# Patient Record
Sex: Male | Born: 1970 | Race: White | Hispanic: No | Marital: Single | Smoking: Never smoker
Health system: Southern US, Community
[De-identification: ages and names within clinical notes are randomized; demographics above are authoritative.]

---

## 2003-05-21 ENCOUNTER — Ambulatory Visit (HOSPITAL_COMMUNITY): Admission: RE | Admit: 2003-05-21 | Discharge: 2003-05-21 | Payer: Self-pay | Admitting: Family Medicine

## 2004-02-09 ENCOUNTER — Emergency Department (HOSPITAL_COMMUNITY): Admission: EM | Admit: 2004-02-09 | Discharge: 2004-02-09 | Payer: Self-pay | Admitting: Emergency Medicine

## 2004-10-20 ENCOUNTER — Ambulatory Visit (HOSPITAL_COMMUNITY): Admission: RE | Admit: 2004-10-20 | Discharge: 2004-10-20 | Payer: Self-pay | Admitting: Family Medicine

## 2007-11-01 ENCOUNTER — Ambulatory Visit (HOSPITAL_COMMUNITY): Admission: RE | Admit: 2007-11-01 | Discharge: 2007-11-01 | Payer: Self-pay | Admitting: Family Medicine

## 2013-04-17 ENCOUNTER — Emergency Department (HOSPITAL_COMMUNITY): Admission: EM | Admit: 2013-04-17 | Discharge: 2013-04-17 | Discharge status: Against Medical Advice | Payer: Self-pay

## 2014-05-13 ENCOUNTER — Ambulatory Visit (HOSPITAL_COMMUNITY)
Admission: RE | Admit: 2014-05-13 | Discharge: 2014-05-13 | Disposition: A | Discharge status: Home / Self Care | Payer: BLUE CROSS/BLUE SHIELD | Source: Ambulatory Visit | Attending: Internal Medicine | Admitting: Internal Medicine

## 2014-05-13 ENCOUNTER — Encounter (INDEPENDENT_AMBULATORY_CARE_PROVIDER_SITE_OTHER): Payer: Self-pay

## 2014-05-13 ENCOUNTER — Other Ambulatory Visit (HOSPITAL_COMMUNITY): Payer: Self-pay | Admitting: Internal Medicine

## 2014-05-13 DIAGNOSIS — R509 Fever, unspecified: Secondary | ICD-10-CM

## 2014-05-13 DIAGNOSIS — J189 Pneumonia, unspecified organism: Secondary | ICD-10-CM | POA: Insufficient documentation

## 2014-05-13 DIAGNOSIS — R05 Cough: Secondary | ICD-10-CM | POA: Diagnosis present

## 2014-05-27 ENCOUNTER — Other Ambulatory Visit: Payer: Self-pay | Admitting: Internal Medicine

## 2014-05-27 ENCOUNTER — Ambulatory Visit
Admission: RE | Admit: 2014-05-27 | Discharge: 2014-05-27 | Disposition: A | Discharge status: Home / Self Care | Payer: BC Managed Care – PPO | Source: Ambulatory Visit | Attending: Internal Medicine | Admitting: Internal Medicine

## 2014-05-27 DIAGNOSIS — Z8701 Personal history of pneumonia (recurrent): Secondary | ICD-10-CM

## 2015-01-16 ENCOUNTER — Emergency Department (HOSPITAL_COMMUNITY): Payer: BC Managed Care – PPO

## 2015-01-16 ENCOUNTER — Encounter (HOSPITAL_COMMUNITY): Payer: Self-pay | Admitting: *Deleted

## 2015-01-16 ENCOUNTER — Emergency Department (HOSPITAL_COMMUNITY)
Admission: EM | Admit: 2015-01-16 | Discharge: 2015-01-16 | Disposition: A | Discharge status: Home / Self Care | Payer: BC Managed Care – PPO | Attending: Emergency Medicine | Admitting: Emergency Medicine

## 2015-01-16 DIAGNOSIS — N23 Unspecified renal colic: Secondary | ICD-10-CM | POA: Insufficient documentation

## 2015-01-16 DIAGNOSIS — Z79899 Other long term (current) drug therapy: Secondary | ICD-10-CM | POA: Diagnosis not present

## 2015-01-16 DIAGNOSIS — R109 Unspecified abdominal pain: Secondary | ICD-10-CM | POA: Diagnosis present

## 2015-01-16 DIAGNOSIS — N200 Calculus of kidney: Secondary | ICD-10-CM | POA: Diagnosis not present

## 2015-01-16 LAB — URINALYSIS, ROUTINE W REFLEX MICROSCOPIC
BILIRUBIN URINE: NEGATIVE
GLUCOSE, UA: NEGATIVE mg/dL
HGB URINE DIPSTICK: NEGATIVE
LEUKOCYTES UA: NEGATIVE
Nitrite: NEGATIVE
PH: 5.5 (ref 5.0–8.0)
PROTEIN: NEGATIVE mg/dL
Specific Gravity, Urine: >1.03 — ABNORMAL HIGH (ref 1.005–1.030)
Urobilinogen, UA: 0.2 mg/dL (ref 0.0–1.0)

## 2015-01-16 LAB — BASIC METABOLIC PANEL
Anion gap: 7 (ref 5–15)
BUN: 11 mg/dL (ref 6–20)
CALCIUM: 9.3 mg/dL (ref 8.9–10.3)
CO2: 27 mmol/L (ref 22–32)
CREATININE: 1.04 mg/dL (ref 0.61–1.24)
Chloride: 103 mmol/L (ref 101–111)
GFR calc Af Amer: >60 mL/min (ref >60)
GLUCOSE: 135 mg/dL — AB (ref 65–99)
Potassium: 3.1 mmol/L — ABNORMAL LOW (ref 3.5–5.1)
Sodium: 137 mmol/L (ref 135–145)

## 2015-01-16 LAB — CBC WITH DIFFERENTIAL/PLATELET
BASOS ABS: 0 10*3/uL (ref 0.0–0.1)
BASOS PCT: 0 %
EOS PCT: 0 %
Eosinophils Absolute: 0 10*3/uL (ref 0.0–0.7)
HEMATOCRIT: 39.9 % (ref 39.0–52.0)
Hemoglobin: 12.9 g/dL — ABNORMAL LOW (ref 13.0–17.0)
Lymphocytes Relative: 9 %
Lymphs Abs: 1.2 10*3/uL (ref 0.7–4.0)
MCH: 26.5 pg (ref 26.0–34.0)
MCHC: 32.3 g/dL (ref 30.0–36.0)
MCV: 82.1 fL (ref 78.0–100.0)
MONO ABS: 0.6 10*3/uL (ref 0.1–1.0)
MONOS PCT: 4 %
Neutro Abs: 12.3 10*3/uL — ABNORMAL HIGH (ref 1.7–7.7)
Neutrophils Relative %: 87 %
PLATELETS: 233 10*3/uL (ref 150–400)
RBC: 4.86 MIL/uL (ref 4.22–5.81)
RDW: 14.6 % (ref 11.5–15.5)
WBC: 14.1 10*3/uL — ABNORMAL HIGH (ref 4.0–10.5)

## 2015-01-16 MED ORDER — HYDROMORPHONE HCL 1 MG/ML IJ SOLN
1.0000 mg | Freq: Once | INTRAMUSCULAR | Status: AC
  Administered 2015-01-16: 1 mg via INTRAVENOUS
  Filled 2015-01-16: qty 1

## 2015-01-16 MED ORDER — TAMSULOSIN HCL 0.4 MG PO CAPS: 0.4000 mg | ORAL_CAPSULE | Freq: Every day | ORAL | Status: DC

## 2015-01-16 MED ORDER — SODIUM CHLORIDE 0.9 % IV BOLUS (SEPSIS)
1000.0000 mL | Freq: Once | INTRAVENOUS | Status: AC
  Administered 2015-01-16: 1000 mL via INTRAVENOUS

## 2015-01-16 MED ORDER — OXYCODONE-ACETAMINOPHEN 5-325 MG PO TABS: ORAL_TABLET | ORAL | Status: AC

## 2015-01-16 MED ORDER — NAPROXEN 250 MG PO TABS: 250.0000 mg | ORAL_TABLET | Freq: Two times a day (BID) | ORAL | Status: DC | PRN

## 2015-01-16 MED ORDER — KETOROLAC TROMETHAMINE 30 MG/ML IJ SOLN
30.0000 mg | Freq: Once | INTRAMUSCULAR | Status: AC
  Administered 2015-01-16: 30 mg via INTRAVENOUS
  Filled 2015-01-16: qty 1

## 2015-01-16 MED ORDER — FENTANYL CITRATE (PF) 100 MCG/2ML IJ SOLN
50.0000 ug | INTRAMUSCULAR | Status: DC | PRN
  Administered 2015-01-16: 50 ug via INTRAVENOUS
  Filled 2015-01-16: qty 2

## 2015-01-16 MED ORDER — NAPROXEN 250 MG PO TABS: 250.0000 mg | ORAL_TABLET | Freq: Two times a day (BID) | ORAL | Status: AC

## 2015-01-16 MED ORDER — TAMSULOSIN HCL 0.4 MG PO CAPS: 0.4000 mg | ORAL_CAPSULE | Freq: Every day | ORAL | Status: AC

## 2015-01-16 MED ORDER — ONDANSETRON HCL 4 MG PO TABS: 4.0000 mg | ORAL_TABLET | Freq: Three times a day (TID) | ORAL | Status: AC | PRN

## 2015-01-16 MED ORDER — HYDROMORPHONE HCL 1 MG/ML IJ SOLN
1.0000 mg | Freq: Once | INTRAMUSCULAR | Status: AC
Start: 2015-01-16 — End: 2015-01-16
  Administered 2015-01-16: 1 mg via INTRAVENOUS
  Filled 2015-01-16: qty 1

## 2015-01-16 MED ORDER — ONDANSETRON HCL 4 MG/2ML IJ SOLN
4.0000 mg | Freq: Once | INTRAMUSCULAR | Status: AC
  Administered 2015-01-16: 4 mg via INTRAVENOUS
  Filled 2015-01-16: qty 2

## 2015-01-16 NOTE — ED Notes (Signed)
Paged Dr Jena Gaussourk to Dr Bebe ShaggyWickline @ [phone]

## 2015-01-16 NOTE — ED Notes (Signed)
Discharge papers given to pt at this time, discussed pain medication . Verbalized understanding. Ambulated off unit with family

## 2015-01-16 NOTE — ED Provider Notes (Signed)
Pt received at sign out with Udip pending. Pt was also given another dose of pain meds, without improvement of his pain. IV toradol and fentanyl given. CT as below. Udip without infection. Pt states he feels "much better now" and wants to go home. Dx and testing d/w pt and family.  Questions answered.  Verb understanding, agreeable to d/c home with outpt f/u.    Ct Renal Stone Study 01/16/2015  CLINICAL DATA:  Left flank pain with trace hematuria EXAM: CT ABDOMEN AND PELVIS WITHOUT CONTRAST TECHNIQUE: Multidetector CT imaging of the abdomen and pelvis was performed following the standard protocol without IV contrast. COMPARISON:  None. FINDINGS: Lung bases are free of acute infiltrate or sizable effusion. The liver, gallbladder, spleen, adrenal glands and pancreas are all normal in their CT appearance. The right kidney is well visualized and demonstrates no renal calculi or obstructive changes. The left kidney demonstrates significant hydronephrosis as well as perinephric stranding. The ureter is prominent and in the distal most aspect of the left ureter there is a tiny 1-2 mm stone identified best seen on image number 76 of series 2. The bladder is well distended. No pelvic mass lesion is seen. The appendix is within normal limits. The vascular hand bony structures are within normal limits. IMPRESSION: 1-2 mm stone in the distal left ureter with hydronephrosis and hydroureter. No other focal abnormality is noted. Electronically Signed   By: Alcide CleverMark  Lukens M.D.   On: 01/16/2015 18:37     Samuel JesterKathleen Ankita Newcomer, DO 01/16/15 1850

## 2015-01-16 NOTE — ED Provider Notes (Signed)
CSN: [id number]     Arrival date & time 01/16/15  1147 History   First MD Initiated Contact with Patient 01/16/15 1348     Chief Complaint  Patient presents with  . Flank Pain     Patient is a 44 y.o. male presenting with flank pain. The history is provided by the patient.  Flank Pain This is a new problem. The current episode started 1 to 2 hours ago. The problem occurs constantly. The problem has been gradually worsening. Associated symptoms include abdominal pain. Pertinent negatives include no chest pain and no shortness of breath. Nothing aggravates the symptoms. Nothing relieves the symptoms.  pt reports sudden onset of left flank pain that radiates to lower abdomen No fever/vomiting He has never had this before Denies h/o kidney stones No CP/SOB No weakness reported   PMH - none  Social History  Substance Use Topics  . Smoking status: Never Smoker   . Smokeless tobacco: Current User  . Alcohol Use: No    Review of Systems  Constitutional: Negative for fever.  Respiratory: Negative for shortness of breath.   Cardiovascular: Negative for chest pain.  Gastrointestinal: Positive for abdominal pain.  Genitourinary: Positive for flank pain.  All other systems reviewed and are negative.     Allergies  Sulfur  Home Medications   Prior to Admission medications   Medication Sig Start Date End Date Taking? Authorizing Provider  acetaminophen (TYLENOL) 500 MG tablet Take 500 mg by mouth every 6 (six) hours as needed.   Yes Historical Provider, MD  Ascorbic Acid (VITAMIN C PO) Take 1 tablet by mouth daily.   Yes Historical Provider, MD  dextromethorphan-guaiFENesin (MUCINEX DM) 30-600 MG 12hr tablet Take 2 tablets by mouth 2 (two) times daily.   Yes Historical Provider, MD   BP 117/75 mmHg  Pulse 87  Temp(Src) 97.5 F (36.4 C) (Oral)  Resp 18  Ht 6' (1.829 m)  Wt 150 lb (68.04 kg)  BMI 20.34 kg/m2  SpO2 99% Physical Exam CONSTITUTIONAL: Well developed/well  nourished HEAD: Normocephalic/atraumatic EYES: EOMI/PERRL ENMT: Mucous membranes moist NECK: supple no meningeal signs SPINE/BACK:entire spine nontender CV: S1/S2 noted, no murmurs/rubs/gallops noted LUNGS: Lungs are clear to auscultation bilaterally, no apparent distress ABDOMEN: soft, nontender, no rebound or guarding, bowel sounds noted throughout abdomen GN:FAOZGU:left cva tenderness, no inguinal hernia, no scrotal tenderness noted NEURO: Pt is awake/alert/appropriate, moves all extremitiesx4.  No facial droop.   EXTREMITIES: pulses normal/equal, full ROM SKIN: warm, color normal PSYCH: no abnormalities of mood noted, alert and oriented to situation  ED Course  Procedures  Labs Review Labs Reviewed  CBC WITH DIFFERENTIAL/PLATELET - Abnormal; Notable for the following:    WBC 14.1 (*)    Hemoglobin 12.9 (*)    Neutro Abs 12.3 (*)    All other components within normal limits  URINALYSIS, ROUTINE W REFLEX MICROSCOPIC (NOT AT [facility])  BASIC METABOLIC PANEL    I have personally reviewed and evaluated these lab results as part of my medical decision-making.   Medications  HYDROmorphone (DILAUDID) injection 1 mg (1 mg Intravenous Given 01/16/15 1428)  ondansetron (ZOFRAN) injection 4 mg (4 mg Intravenous Given 01/16/15 1428)  sodium chloride 0.9 % bolus 1,000 mL (1,000 mLs Intravenous New Bag/Given 01/16/15 1428)   4:01 PM Pt with likely new onset kidney stone Urinalysis pending At signout to dr Clarene Dukemcmanus, f/u on urinalysis and if pain uncontrolled may need CT imaging MDM   Final diagnoses:  None    Nursing notes including  past medical history and social history reviewed and considered in documentation Labs/vital reviewed myself and considered during evaluation     Zadie Rhine, MD 01/16/15 1601

## 2015-01-16 NOTE — ED Notes (Signed)
Pt encouraged to provide urine sample- instructed that if unable to void will probably need to be catheterized -- Pt stated that he would attempt to provide sample

## 2015-01-16 NOTE — ED Notes (Signed)
Patient c/o left flank pain that started about an hour ago. Denies dysuria or hx of kidney stones.

## 2015-01-16 NOTE — Discharge Instructions (Signed)
°Emergency Department Resource Guide °1) Find a Doctor and Pay Out of Pocket °Although you won't have to find out who is covered by your insurance plan, it is a good idea to ask around and get recommendations. You will then need to call the office and see if the doctor you have chosen will accept you as a new patient and what types of options they offer for patients who are self-pay. Some doctors offer discounts or will set up payment plans for their patients who do not have insurance, but you will need to ask so you aren't surprised when you get to your appointment. ° °2) Contact Your Local Health Department °Not all health departments have doctors that can see patients for sick visits, but many do, so it is worth a call to see if yours does. If you don't know where your local health department is, you can check in your phone book. The CDC also has a tool to help you locate your state's health department, and many state websites also have listings of all of their local health departments. ° °3) Find a Walk-in Clinic °If your illness is not likely to be very severe or complicated, you may want to try a walk in clinic. These are popping up all over the country in pharmacies, drugstores, and shopping centers. They're usually staffed by nurse practitioners or physician assistants that have been trained to treat common illnesses and complaints. They're usually fairly quick and inexpensive. However, if you have serious medical issues or chronic medical problems, these are probably not your best option. ° °No Primary Care Doctor: °- Call [facility] at  832-8000 - they can help you locate a primary care doctor that  accepts your insurance, provides certain services, etc. °- Physician Referral Service- 1-800-533-3463 ° °Chronic Pain Problems: °Organization         Address  Phone   Notes  °[facility]  (336) 297-2271 Patients need to be referred by their primary care doctor.  ° °Medication  Assistance: °Organization         Address  Phone   Notes  °[place] [facility] [address] °[place], [place] [address] (336) 641-8030 --Must be a resident of [place] °-- Must have NO insurance coverage whatsoever (no Medicaid/ Medicare, etc.) °-- The pt. MUST have a primary care doctor that directs their care regularly and follows them in the community °  °[facility]  (866) 331-1348   °[employer]  (888) 892-1162   ° °Agencies that provide inexpensive medical care: °Organization         Address  Phone   Notes  °[facility]  (336) 832-8035   °[facility]    (336) 832-7272   °[facility] [address] °[place], [place] [address] (336) 832-4777   °[facility] [address], °[place] (336) 271-4999   °[facility]    (336) 373-0678   °[facility]    (336) 272-1050   °[facility] ° [address], [place] Phone:  (336) 832-4444, Fax:  (336) 832-4440 Hours of Operation:  9 am - 6 pm, M-F.  Also accepts Medicaid/Medicare and self-pay.  °[facility] ° [address],  Phone: (336) 832-3150, Fax: (336) 832-3151. Hours of Operation:  8:30 am - 5:30 pm, M-F.  Also accepts Medicaid and self-pay.  °[facility] [place] [address], [place] Phone: (336) 878-6027   °[facility] [address], [place], [place] (336)723-1848, Ext. 123 Mondays & Thursdays: 7-9 AM.  First 15 patients are seen on a first come, first serve basis. °  ° °Medicaid-accepting [place] Providers: ° °Organization         Address  Phone   Notes  °[facility] [address], [place] (336) 641-2100 Also accepts self-pay patients.  °[facility] [address], [place] ° (336) 856-9996   °[facility] [address], [place]  (336) 288-8857   °[facility] [address] [place] [address], [place] (336) 299-7000   °Veita Bland [address], [place]  ° (336) 373-1557 Only accepts [place] [employer] Medicaid patients after they have their name applied to their card.  ° °Self-Pay (no insurance) in [place]: ° °Organization         Address  Phone   Notes  °Sickle Cell Patients, [facility] [address], [place] (336) 832-1970   °[facility] [address], [place] (336) 832-4400   °[facility] ° [address], [place] (336) 992-4800   °[facility]/Dr. Osei-Bonsu ° [address] [place] [address], [place] or [address], [place] (336) [phone] Phone number for both [place] and [place] locations is the same.  °[facility] [address], [place] (336) 299-0000   °[facility] [address] [place] [address], [place] or [address] (336) 852-7530 °(336) 878-2260   °[facility] [address], [place] (336) 350-1642, phone; (336) 294-5005, fax Sees patients 1st and 3rd Saturday of every month.  Must not qualify for public or private insurance (i.e. Medicaid, Medicare, [place] Health Choice, Veterans' Benefits) • Household income should be no more than 200% of the poverty level •The clinic cannot treat you if you are pregnant or think you are pregnant • Sexually transmitted diseases are not treated at the clinic.  ° ° °Dental Care: °Organization         Address  Phone  Notes  °[place] [facility] [address], [place] (336) 641-6152 Accepts children up to age 21 who are enrolled in Medicaid or [place] Health Choice; pregnant women with a Medicaid card; and children who have applied for Medicaid or [place] Health Choice, but were declined, whose parents can pay a reduced fee at time of service.  °[place]  [facility] [place]  [address], [place] (336) 641-7733 Accepts children up to age 21 who are enrolled in Medicaid or [place] Health Choice; pregnant women with a Medicaid card; and children who have applied for Medicaid or  Health Choice, but were declined, whose parents can pay a reduced fee at time of service.  °[facility] ° [address], [place] (336) 641-4533 Patients are seen by appointment only. Walk-ins are not accepted. [facility] will see patients 18 years of age and older. °Monday - Tuesday (8am-5pm) °Most Wednesdays (8:30-5pm) °$30 per visit, cash only  °[facility] ° [address], [place] (336) 641-4533 Patients are seen by appointment only. Walk-ins are not accepted. [facility] will see patients 18 years of age and older. °One   Wednesday Evening (Monthly: Volunteer Based).  $30 per visit, cash only  °[facility]  (919) 537-3737 for adults; Children under age 4, call Graduate Pediatric Dentistry at (919) [phone]. Children aged 4-14, please call (919) [phone] to request a pediatric application. ° Dental services are provided in all areas of dental care including fillings, crowns and bridges, complete and partial dentures, implants, gum treatment, root canals, and extractions. Preventive care is also provided. Treatment is provided to both adults and children. °Patients are selected via a lottery and there is often a waiting list. °  °[facility] [address], °[place] ° (336) 763-8833 [email or web address] °  °[facility] [address], [place], [place] (336)723-1848, Ext. 123 Second and Fourth Thursday of each month, opens at 6:30 AM; Clinic ends at 9 AM.  Patients are seen on a first-come first-served basis, and a limited number are seen during each clinic.  ° °[facility] ° [address], [place], [place] (336) 723-7904    Eligibility Requirements °You must have lived in [place], [place], or [place] counties for at least the last three months. °  You cannot be eligible for state or federal sponsored healthcare insurance, including Veterans Administration, Medicaid, or Medicare. °  You generally cannot be eligible for healthcare insurance through your employer.  °  How to apply: °Eligibility screenings are held every Tuesday and Wednesday afternoon from 1:00 pm until 4:00 pm. You do not need an appointment for the interview!  °[facility] [address], [place], [place] 336-631-2330   °[facility] [facility]  336-342-8273   °[facility]  336-703-3100   °[facility]  336-570-6415   ° °Behavioral Health Resources in the Community: °Intensive Outpatient Programs °Organization         Address  Phone  Notes  °[facility] [address], [place], [place] 336-878-6098   °[facility] [address], [place], [place] 336-832-9800   °[facility] [address], [place], [place] ° 336-882-2125   °[facility] [address],  °[place], [place] 1-800-853-5163 or 336-641-4981   °Substance Abuse Resources °Organization         Address  Phone  Notes  °[facility]  336-882-2125   °[facility]  336-784-9470   °[facility]  336-285-9073   °[facility]  336-845-3988   °Residential & Outpatient Substance Abuse Program  1-800-659-3381   °Psychological Services °Organization         Address  Phone  Notes  °[facility]  336- 832-9600   °[facility]  336- 378-7881   °[facility] [address], [place] 1-800-853-5163 or 336-641-4981   ° °Mobile Crisis Teams °Organization         Address  Phone  Notes  °[facility], Mobile Crisis Care Unit  1-877-626-1772   °Assertive °[facility] ° [address]  [place], [place] 336-834-9664   °Sharon DeEsch [address] °[place] 336-554-5454   ° °Self-Help/Support Groups °Organization         Address  Phone             Notes  °[facility] [place] - variety of support groups  336- 373-1402 Call for more information  °Narcotics Anonymous (NA), [facility] [address], °[place]  2 meetings at this location  ° °  Residential Treatment Programs °Organization         Address  Phone  Notes  °[facility] [address],    °[place]  1-866-801-8205   °[facility] ° [address], [place], [place] 704-293-8524   °[facility] [address], [place] 336-845-3988 Admissions: 8am-3pm M-F  °[facility] [address]    °[place], [place] 336-841-1104   °[facility] [address], [place], [place] 336-379-7146   °[facility] [address]  °[place], [place] 336-285-9073   °[facility] - Intensive Outpatient [address], [place], [place] 336-852-3033   °[facility] [address]  °[place], [place] 1-877-615-2722 or 336-784-9470   °[facility] [address] [place], [place] 336-227-7417 Accepts Medicaid  °[facility] [address]  °[place] 1-800-659-3381 Substance Abuse/Addiction Treatment  ° °[place] Behavioral Health Resources °Organization         Address  Phone  Notes  °[facility]  (888) 581-9988   °Julie Brannon, PhD [address] [place], [place]   (336) 349-5553 or (336) 951-0000   °[facility]   [address] °[place], [place] (336) 349-4454   °[facility] [address], [place], [place] (336) 342-8316 Insurance/Medicaid/sponsorship through [facility]  °[facility] [address]                                    [place], [place] (336) 342-8316 Therapy/tele-psych/case    °[facility] [address]  ° [place], [place] (336) 349-2233    °Dr. Arfeen  (336) 349-4544   °[facility] [place]  [employer] [place] [facility] 1) [address], [place] °2) [address], [place] °3)  [address], [place] (336) 349-3220 °(336) 342-7768 ° °(336) 342-8140   °[place] [facility] (336) 342-1394 or (336) 342-3537 (After Hours)    ° ° °Take the prescriptions as directed. Call the Urologist tomorrow to schedule a follow up appointment within the next week.  Return to the Emergency Department immediately if worsening. ° °

## 2015-01-16 NOTE — ED Notes (Signed)
Pt requesting something else for pain - stated still unable to provide urine sample , Dr Bebe ShaggyWickline informed and into room to see pt

## 2015-01-16 NOTE — ED Notes (Signed)
Dr Clarene DukeMcManus into room to see pt

## 2015-02-04 ENCOUNTER — Ambulatory Visit (INDEPENDENT_AMBULATORY_CARE_PROVIDER_SITE_OTHER): Payer: BC Managed Care – PPO | Admitting: Urology

## 2015-02-04 DIAGNOSIS — N201 Calculus of ureter: Secondary | ICD-10-CM

## 2016-02-03 ENCOUNTER — Ambulatory Visit: Payer: BC Managed Care – PPO | Admitting: Urology

## 2016-04-28 IMAGING — DX DG CHEST 2V
2 series · 2 of 2 positions shown · non-contrast
Comparison: PA chest x-ray dated November 01, 2007

CLINICAL DATA: Three days of cough and congestion, onset weakness
today

EXAM:
CHEST  2 VIEW

[chest pa]
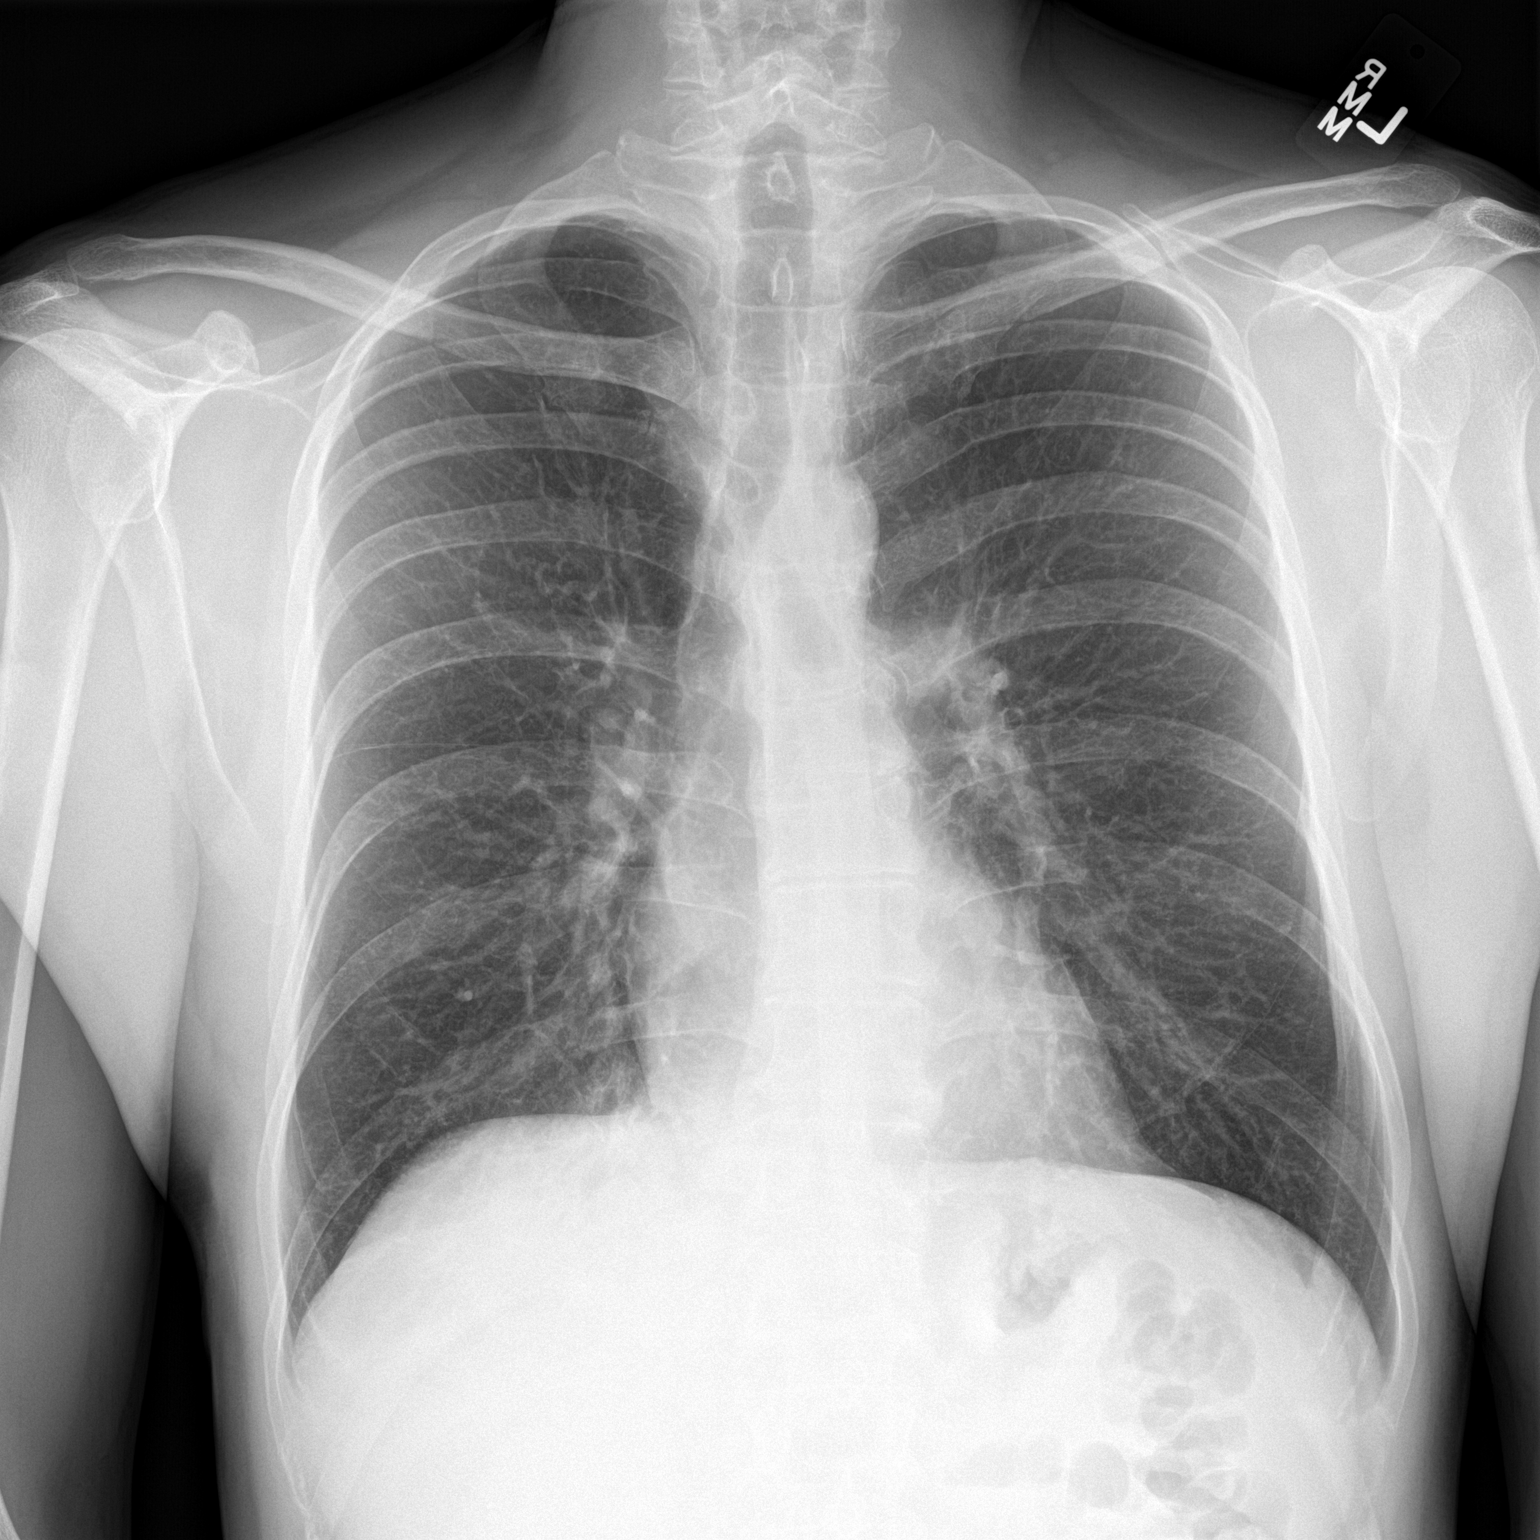

[chest lat]
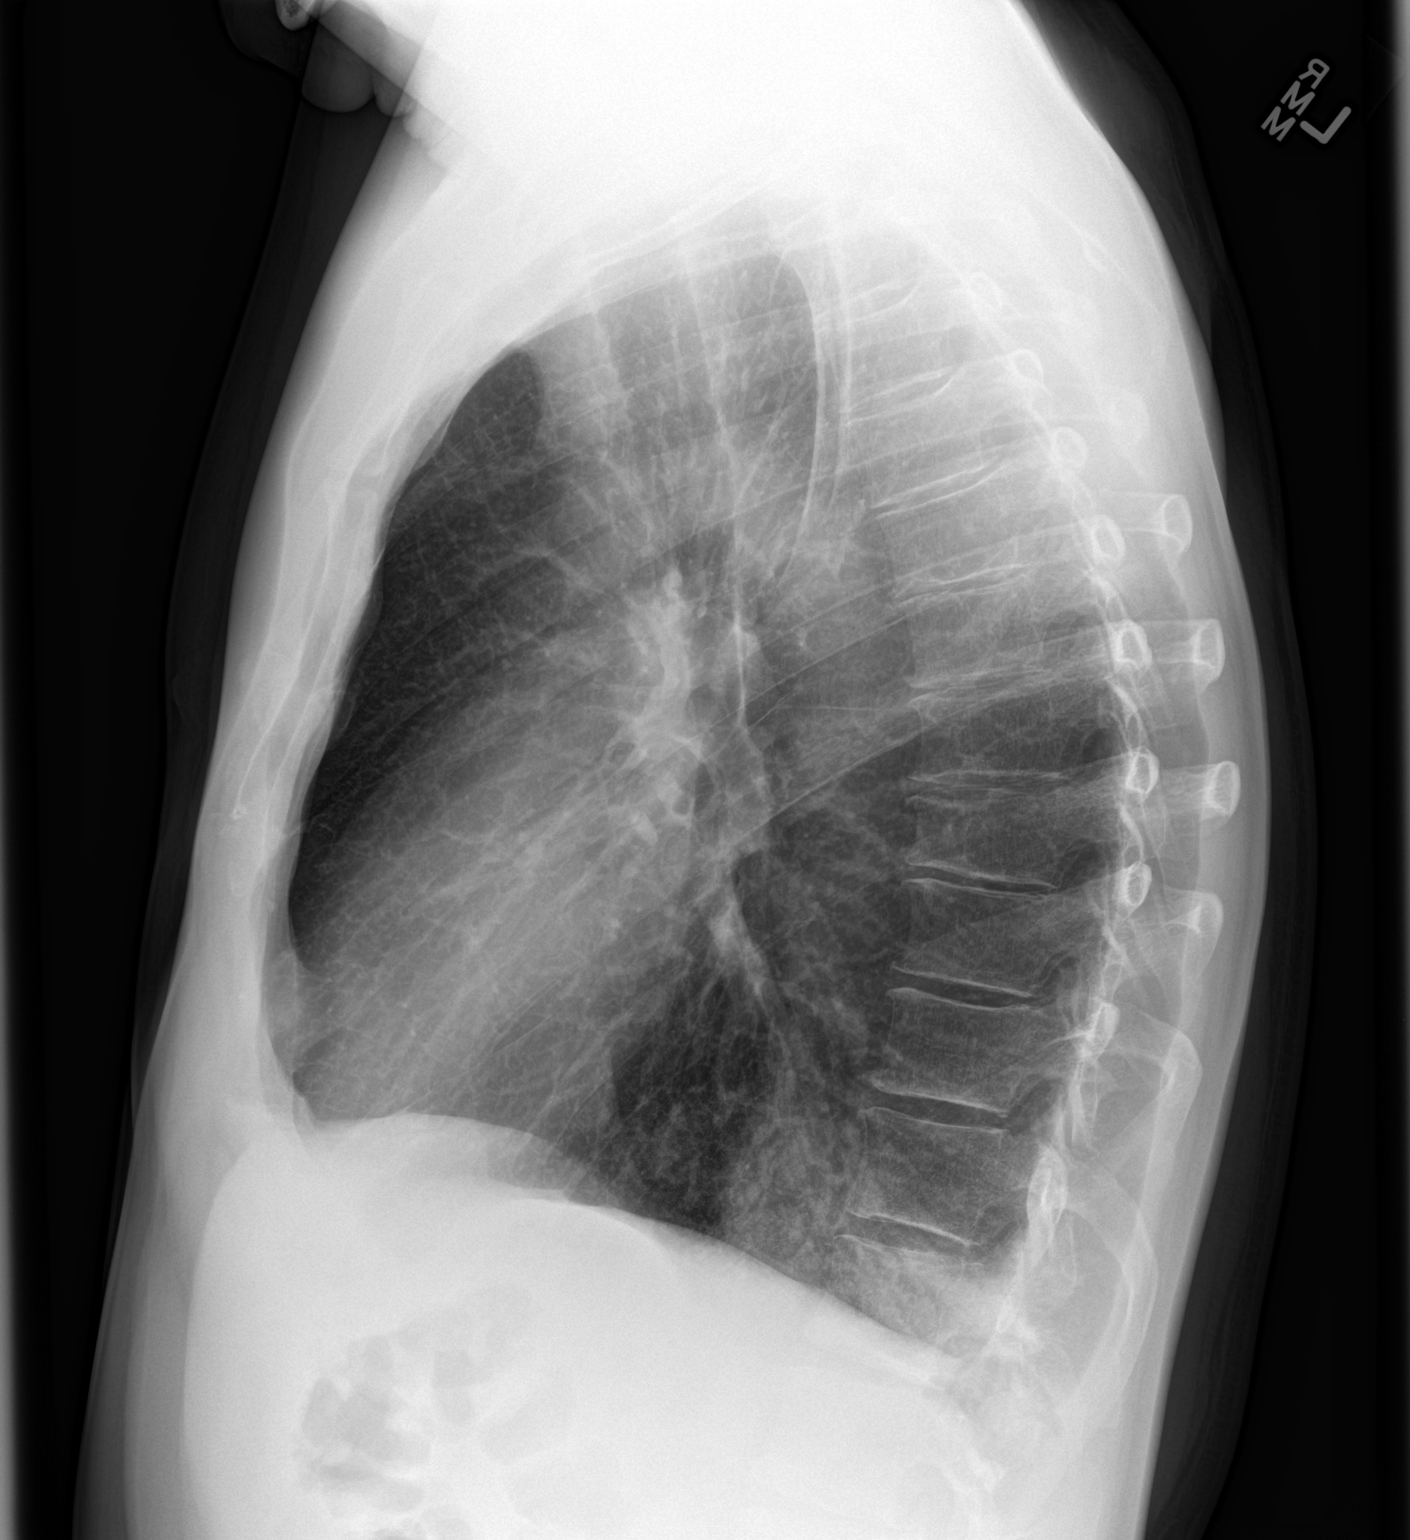

[2 of 2 positions shown; findings below may reference images not displayed]

FINDINGS: The lungs are well-expanded. There is an infiltrate in the right
lower lobe posterior medially. The heart and pulmonary vascularity
are normal. The mediastinum is normal in width. There is no pleural
effusion or pneumothorax. The bony thorax is unremarkable.
IMPRESSION: Right lower lobe pneumonia superimposed upon COPD or reactive airway
disease. Follow-up radiographs following anticipated antibiotic
therapy are recommended to assure clearing.

## 2018-11-09 ENCOUNTER — Other Ambulatory Visit: Payer: Self-pay

## 2018-11-09 DIAGNOSIS — Z20822 Contact with and (suspected) exposure to covid-19: Secondary | ICD-10-CM

## 2018-11-11 LAB — NOVEL CORONAVIRUS, NAA: SARS-CoV-2, NAA: NOT DETECTED

## 2019-06-20 ENCOUNTER — Ambulatory Visit
Admission: RE | Admit: 2019-06-20 | Discharge: 2019-06-20 | Disposition: A | Discharge status: Home / Self Care | Payer: BC Managed Care – PPO | Source: Ambulatory Visit | Attending: Internal Medicine | Admitting: Internal Medicine

## 2019-06-20 ENCOUNTER — Other Ambulatory Visit: Payer: Self-pay | Admitting: Internal Medicine

## 2019-06-20 DIAGNOSIS — R1013 Epigastric pain: Secondary | ICD-10-CM

## 2020-01-03 ENCOUNTER — Other Ambulatory Visit (HOSPITAL_COMMUNITY): Payer: Self-pay | Admitting: Internal Medicine

## 2020-01-03 DIAGNOSIS — M79622 Pain in left upper arm: Secondary | ICD-10-CM

## 2020-01-18 ENCOUNTER — Telehealth (HOSPITAL_COMMUNITY): Payer: Self-pay | Admitting: *Deleted

## 2020-01-18 NOTE — Telephone Encounter (Signed)
Close encounter 

## 2020-01-19 ENCOUNTER — Other Ambulatory Visit (HOSPITAL_COMMUNITY)
Admission: RE | Admit: 2020-01-19 | Discharge: 2020-01-19 | Disposition: A | Discharge status: Home / Self Care | Payer: BC Managed Care – PPO | Source: Ambulatory Visit | Attending: Internal Medicine | Admitting: Internal Medicine

## 2020-01-19 DIAGNOSIS — Z01812 Encounter for preprocedural laboratory examination: Secondary | ICD-10-CM | POA: Diagnosis present

## 2020-01-19 DIAGNOSIS — Z20822 Contact with and (suspected) exposure to covid-19: Secondary | ICD-10-CM | POA: Insufficient documentation

## 2020-01-19 LAB — SARS CORONAVIRUS 2 (TAT 6-24 HRS): SARS Coronavirus 2: NEGATIVE

## 2020-01-23 ENCOUNTER — Other Ambulatory Visit: Payer: Self-pay

## 2020-01-23 ENCOUNTER — Ambulatory Visit (HOSPITAL_COMMUNITY)
Admission: RE | Admit: 2020-01-23 | Discharge: 2020-01-23 | Disposition: A | Discharge status: Home / Self Care | Payer: BC Managed Care – PPO | Source: Ambulatory Visit | Attending: Cardiovascular Disease | Admitting: Cardiovascular Disease

## 2020-01-23 DIAGNOSIS — M79622 Pain in left upper arm: Secondary | ICD-10-CM | POA: Diagnosis not present

## 2020-01-23 LAB — EXERCISE TOLERANCE TEST
Estimated workload: 11.8 METS
Exercise duration (min): 10 min
Exercise duration (sec): 1 s
MPHR: 173 {beats}/min
Peak HR: 179 {beats}/min
Percent HR: 103 %
Rest HR: 83 {beats}/min

## 2022-06-24 ENCOUNTER — Other Ambulatory Visit: Payer: Self-pay | Admitting: Internal Medicine

## 2022-06-24 ENCOUNTER — Ambulatory Visit
Admission: RE | Admit: 2022-06-24 | Discharge: 2022-06-24 | Disposition: A | Discharge status: Home / Self Care | Payer: BC Managed Care – PPO | Source: Ambulatory Visit | Attending: Internal Medicine | Admitting: Internal Medicine

## 2022-06-24 DIAGNOSIS — R0789 Other chest pain: Secondary | ICD-10-CM
